# Patient Record
Sex: Male | Born: 1995 | Race: White | Hispanic: No | Marital: Single | Smoking: Current every day smoker
Health system: Southern US, Community
[De-identification: ages and names within clinical notes are randomized; demographics above are authoritative.]

## PROBLEM LIST (undated history)

## (undated) DIAGNOSIS — F909 Attention-deficit hyperactivity disorder, unspecified type: Secondary | ICD-10-CM

---
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
## 2016-10-08 ENCOUNTER — Emergency Department (HOSPITAL_COMMUNITY): Payer: Medicaid Other

## 2016-10-08 ENCOUNTER — Emergency Department (HOSPITAL_COMMUNITY)
Admission: EM | Admit: 2016-10-08 | Discharge: 2016-10-08 | Disposition: A | Discharge status: Home / Self Care | Payer: Medicaid Other | Attending: Emergency Medicine | Admitting: Emergency Medicine

## 2016-10-08 ENCOUNTER — Encounter (HOSPITAL_COMMUNITY): Payer: Self-pay | Admitting: Emergency Medicine

## 2016-10-08 DIAGNOSIS — Y92410 Unspecified street and highway as the place of occurrence of the external cause: Secondary | ICD-10-CM | POA: Diagnosis not present

## 2016-10-08 DIAGNOSIS — F909 Attention-deficit hyperactivity disorder, unspecified type: Secondary | ICD-10-CM | POA: Insufficient documentation

## 2016-10-08 DIAGNOSIS — Z791 Long term (current) use of non-steroidal anti-inflammatories (NSAID): Secondary | ICD-10-CM | POA: Insufficient documentation

## 2016-10-08 DIAGNOSIS — S30810A Abrasion of lower back and pelvis, initial encounter: Secondary | ICD-10-CM | POA: Insufficient documentation

## 2016-10-08 DIAGNOSIS — F17210 Nicotine dependence, cigarettes, uncomplicated: Secondary | ICD-10-CM | POA: Diagnosis not present

## 2016-10-08 DIAGNOSIS — R51 Headache: Secondary | ICD-10-CM | POA: Insufficient documentation

## 2016-10-08 DIAGNOSIS — R519 Headache, unspecified: Secondary | ICD-10-CM

## 2016-10-08 DIAGNOSIS — T07XXXA Unspecified multiple injuries, initial encounter: Secondary | ICD-10-CM

## 2016-10-08 DIAGNOSIS — Y9389 Activity, other specified: Secondary | ICD-10-CM | POA: Insufficient documentation

## 2016-10-08 DIAGNOSIS — R55 Syncope and collapse: Secondary | ICD-10-CM | POA: Diagnosis present

## 2016-10-08 DIAGNOSIS — S50311A Abrasion of right elbow, initial encounter: Secondary | ICD-10-CM | POA: Insufficient documentation

## 2016-10-08 DIAGNOSIS — Y998 Other external cause status: Secondary | ICD-10-CM | POA: Insufficient documentation

## 2016-10-08 DIAGNOSIS — S00211A Abrasion of right eyelid and periocular area, initial encounter: Secondary | ICD-10-CM | POA: Insufficient documentation

## 2016-10-08 DIAGNOSIS — S50312A Abrasion of left elbow, initial encounter: Secondary | ICD-10-CM | POA: Insufficient documentation

## 2016-10-08 HISTORY — DX: Attention-deficit hyperactivity disorder, unspecified type: F90.9

## 2016-10-08 MED ORDER — FENTANYL CITRATE (PF) 100 MCG/2ML IJ SOLN
50.0000 ug | Freq: Once | INTRAMUSCULAR | Status: AC
  Administered 2016-10-08: 50 ug via INTRAVENOUS
  Filled 2016-10-08: qty 2

## 2016-10-08 MED ORDER — HYDROCODONE-ACETAMINOPHEN 5-325 MG PO TABS: 1.0000 | ORAL_TABLET | ORAL | 0 refills | Status: AC | PRN

## 2016-10-08 MED ORDER — IBUPROFEN 800 MG PO TABS: 800.0000 mg | ORAL_TABLET | Freq: Three times a day (TID) | ORAL | 0 refills | Status: AC
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
## 2016-10-08 NOTE — Discharge Instructions (Signed)
As we discussed, your imaging today was normal. Take the prescribed medication as directed. Take the Motrin as needed for moderate pain, use the Vicodin for severe pain. Do not drive while taking the Vicodin as it can make you sleepy or drowsy. Follow-up with your primary care doctor for any ongoing issues. You may return here for any new or worsening symptoms.
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
## 2016-10-08 NOTE — ED Notes (Signed)
Pt unable to sign due to being in hallway pt states understanding discharge instructions and stabler and ambulatory for discharge
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
## 2023-01-29 ENCOUNTER — Ambulatory Visit: Payer: Medicaid Other | Admitting: Internal Medicine

## 2023-01-29 NOTE — Progress Notes (Deleted)
NEW PATIENT Date of Service/Encounter:  01/29/23 Referring provider: none-self referred Primary care provider: Patient, No Pcp Per  Subjective:  Nathan Wilcox is a 27 y.o. male with a PMHx of *** presenting today for evaluation of *** History obtained from: chart review and {Persons; PED relatives w/patient:19415::"patient"}.   Discussed the use of AI scribe software for clinical note transcription with the patient, who gave verbal consent to proceed.  History of Present Illness            Chart Review:  Reviewed PCP notes from referral ***: ***  Other allergy screening: Asthma: {Blank single:19197::"yes","no"} Rhino conjunctivitis: {Blank single:19197::"yes","no"} Food allergy: {Blank single:19197::"yes","no"} Medication allergy: {Blank single:19197::"yes","no"} Hymenoptera allergy: {Blank single:19197::"yes","no"} Urticaria: {Blank single:19197::"yes","no"} Eczema:{Blank single:19197::"yes","no"} History of recurrent infections suggestive of immunodeficency: {Blank single:19197::"yes","no"} ***Vaccinations are up to date.   Past Medical History: Past Medical History:  Diagnosis Date   ADHD (attention deficit hyperactivity disorder)    Medication List:  Current Outpatient Medications  Medication Sig Dispense Refill   HYDROcodone-acetaminophen (NORCO/VICODIN) 5-325 MG tablet Take 1 tablet by mouth every 4 (four) hours as needed. 10 tablet 0   ibuprofen (ADVIL,MOTRIN) 800 MG tablet Take 1 tablet (800 mg total) by mouth 3 (three) times daily. 21 tablet 0   No current facility-administered medications for this visit.   Known Allergies:  Allergies  Allergen Reactions   Risperidone And Related    Past Surgical History: No past surgical history on file. Family History: No family history on file. Social History: Nathan Wilcox lives ***.   ROS:  All other systems negative except as noted per HPI.  Objective:  There were no vitals taken for this visit. There is  no height or weight on file to calculate BMI. Physical Exam:  General Appearance:  Alert, cooperative, no distress, appears stated age  Head:  Normocephalic, without obvious abnormality, atraumatic  Eyes:  Conjunctiva clear, EOM's intact  Ears {Blank multiple:19196:a:"***","EACs normal bilaterally","normal TMs bilaterally","ear tubes present bilaterally without exudate"}  Nose: Nares normal, {Blank multiple:19196:a:"***","hypertrophic turbinates","normal mucosa","no visible anterior polyps","septum midline"}  Throat: Lips, tongue normal; teeth and gums normal, {Blank multiple:19196:a:"***","normal posterior oropharynx","tonsils 2+","tonsils 3+","no tonsillar exudate","+ cobblestoning","surgically absent tonsils","mildly erythematous posterior oropharynx"}  Neck: Supple, symmetrical  Lungs:   {Blank multiple:19196:a:"***","clear to auscultation bilaterally","end-expiratory wheezing","wheezing throughout"}, Respirations unlabored, {Blank multiple:19196:a:"***","no coughing","intermittent dry coughing","intermittent productive-sounding cough"}  Heart:  {Blank multiple:19196:a:"***","regular rate and rhythm","no murmur"}, Appears well perfused  Extremities: No edema  Skin: {Blank multiple:19196:a:"***","erythematous, dry patches scattered on ***","lichenification on ***","Skin color, texture, turgor normal","no rashes or lesions on visualized portions of skin"}  Neurologic: No gross deficits   Diagnostics: Spirometry:  Tracings reviewed. His effort: {Blank single:19197::"Good reproducible efforts.","It was hard to get consistent efforts and there is a question as to whether this reflects a maximal maneuver.","Poor effort, data can not be interpreted.","Variable effort-results affected","effort okay for first attempt at spirometry.","Results not reproducible due to ***"} FVC: ***L (pre), ***L  (post) FEV1: ***L, ***% predicted (pre), ***L, ***% predicted (post) FEV1/FVC ratio: *** (pre), ***  (post) Interpretation: {Blank single:19197::"Spirometry consistent with mild obstructive disease","Spirometry consistent with moderate obstructive disease","Spirometry consistent with severe obstructive disease","Spirometry consistent with possible restrictive disease","Spirometry consistent with mixed obstructive and restrictive disease","Spirometry uninterpretable due to technique","Spirometry consistent with normal pattern","No overt abnormalities noted given today's efforts","Nonobstructive ratio, low FEV1","Nonobstructive ratio, low FEV1, possible restriction"}.  Please see scanned spirometry results for details.  Skin Testing: {Blank single:19197::"Select foods","Environmental allergy panel","Environmental allergy panel and select foods","Food allergy panel","None","Deferred due to recent antihistamines use","deferred due to recent reaction","Pediatric  Environmental Allergy Panel","Pediatric Food Panel","Select foods and environmental allergies"}. {Blank single:19197::"Adequate positive and negative controls","Inadequate positive control-testing invalid","Adequate positive and negative controls, dermatographism present, testing difficult to interpret"}. Results discussed with patient/family.   {Blank single:19197::"Allergy testing results were read and interpreted by myself, documented by clinical staff.","Allergy testing results were read by ***,FNP, documented by clinical staff"}  Labs:  Lab Orders  No laboratory test(s) ordered today     Assessment and Plan  Assessment and Plan               {Blank single:19197::"This note in its entirety was forwarded to the Provider who requested this consultation."}  Other: {Blank multiple:19196:a:"***","samples provided of: ***","school forms provided","reviewed spirometry technique","reviewed inhaler technique"}  Thank you for your kind referral. I appreciate the opportunity to take part in Eames's care. Please do not hesitate to contact  me with questions.***  Sincerely,  Tonny Bollman, MD [facility]

## 2023-10-15 ENCOUNTER — Ambulatory Visit: Admitting: Internal Medicine
